# Patient Record
Sex: Female | Born: 1945 | Race: White | Hispanic: No | Marital: Married | Smoking: Never smoker
Health system: Southern US, Community
[De-identification: ages and names within clinical notes are randomized; demographics above are authoritative.]

## PROBLEM LIST (undated history)

## (undated) DIAGNOSIS — L409 Psoriasis, unspecified: Secondary | ICD-10-CM

## (undated) DIAGNOSIS — I10 Essential (primary) hypertension: Secondary | ICD-10-CM

## (undated) DIAGNOSIS — E7800 Pure hypercholesterolemia, unspecified: Secondary | ICD-10-CM

---

## 2000-04-22 ENCOUNTER — Inpatient Hospital Stay (HOSPITAL_COMMUNITY): Admission: AD | Admit: 2000-04-22 | Discharge: 2000-04-27 | Payer: Self-pay | Admitting: Internal Medicine

## 2000-04-23 ENCOUNTER — Encounter: Payer: Self-pay | Admitting: Internal Medicine

## 2001-12-19 ENCOUNTER — Ambulatory Visit (HOSPITAL_COMMUNITY): Admission: RE | Admit: 2001-12-19 | Discharge: 2001-12-19 | Payer: Self-pay | Admitting: Internal Medicine

## 2001-12-19 ENCOUNTER — Encounter: Payer: Self-pay | Admitting: Internal Medicine

## 2010-10-09 ENCOUNTER — Other Ambulatory Visit (HOSPITAL_COMMUNITY): Payer: Self-pay | Admitting: Dermatology

## 2010-10-09 DIAGNOSIS — Z79899 Other long term (current) drug therapy: Secondary | ICD-10-CM

## 2010-10-20 ENCOUNTER — Other Ambulatory Visit: Payer: Self-pay | Admitting: Diagnostic Radiology

## 2010-10-20 ENCOUNTER — Ambulatory Visit (HOSPITAL_COMMUNITY): Payer: Medicare Other

## 2010-10-20 ENCOUNTER — Other Ambulatory Visit: Payer: Self-pay | Admitting: Dermatology

## 2010-10-20 ENCOUNTER — Ambulatory Visit (HOSPITAL_COMMUNITY)
Admission: RE | Admit: 2010-10-20 | Discharge: 2010-10-20 | Disposition: A | Discharge status: Home / Self Care | Payer: Medicare Other | Source: Ambulatory Visit | Attending: Dermatology | Admitting: Dermatology

## 2010-10-20 DIAGNOSIS — Z79899 Other long term (current) drug therapy: Secondary | ICD-10-CM

## 2010-10-20 DIAGNOSIS — Z79631 Long term (current) use of antimetabolite agent: Secondary | ICD-10-CM

## 2010-10-20 DIAGNOSIS — Z5181 Encounter for therapeutic drug level monitoring: Secondary | ICD-10-CM | POA: Insufficient documentation

## 2010-10-20 LAB — APTT: aPTT: 33 seconds (ref 24–37)

## 2010-10-20 LAB — PROTIME-INR
INR: 1.03 (ref 0.00–1.49)
Prothrombin Time: 13.7 seconds (ref 11.6–15.2)

## 2010-10-20 LAB — CBC
HCT: 40.1 % (ref 36.0–46.0)
Hemoglobin: 13 g/dL (ref 12.0–15.0)
MCH: 30.2 pg (ref 26.0–34.0)
MCHC: 32.4 g/dL (ref 30.0–36.0)
MCV: 93 fL (ref 78.0–100.0)
Platelets: 214 10*3/uL (ref 150–400)
RBC: 4.31 MIL/uL (ref 3.87–5.11)
RDW: 14.1 % (ref 11.5–15.5)
WBC: 6.6 10*3/uL (ref 4.0–10.5)

## 2012-10-29 IMAGING — US US BIOPSY
1 series · 13 of 17 positions shown · non-contrast
Comparison: none

Clinical: Long-term methotrexate use.  Request made for random
liver core biopsy.

ULTRASOUND-GUIDED RANDOM CORE LIVER BIOPSY:
An ultrasound guided liver biopsy was thoroughly discussed with the
patient and questions were answered. The benefits, risks,
alternatives, and complications were also discussed. The patient
understands and wishes to proceed with the procedure. A verbal as
well as written consent was obtained.
Ultrasound imaging of the liver was performed and an appropriate
skin entry site was determined. Skin site was marked, prepped and
draped in the usual sterile fashion. 1% Lidocaine was infiltrated
locally. A 17 gauge Trocar needle was advanced under ultrasound
guidance into the liver. Imaging was obtained documenting
appropriate needle position.  Three coaxial 18 gauge core samples
were then obtained and sent to the laboratory for further analysis.
A small amount of Gelfoam was inserted under ultrasound guidance to
assist with hemostasis.  Post procedure scans demonstrate no
evidence of bleeding or hematoma.  The patient tolerated the
procedure well with no immediate complication.
Cardiac and respiratory monitoring was provided by the radiology
RN.  Moderate sedation in the form of 75 mcg fentanyl and 4 mg of
versed were administered throughout the procedure for a total of 20
minutes.

[Series 1: us biopsy · 0.26mm/px · 13 of 17 slices shown]
[im 1/17]
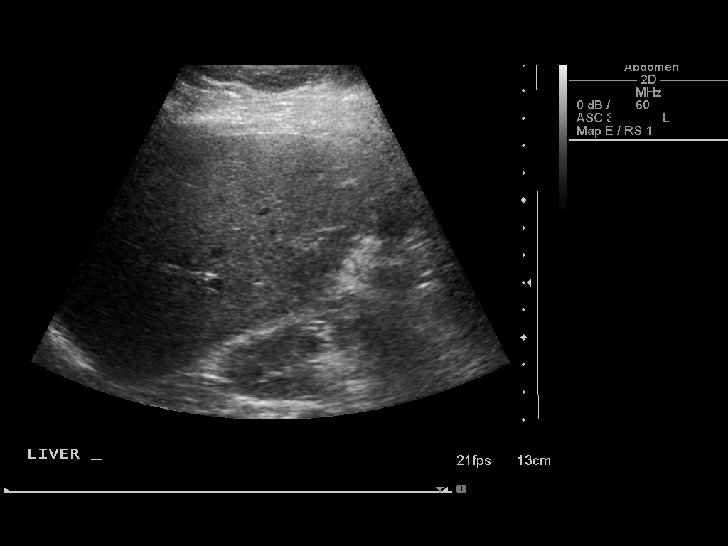
[im 2/17]
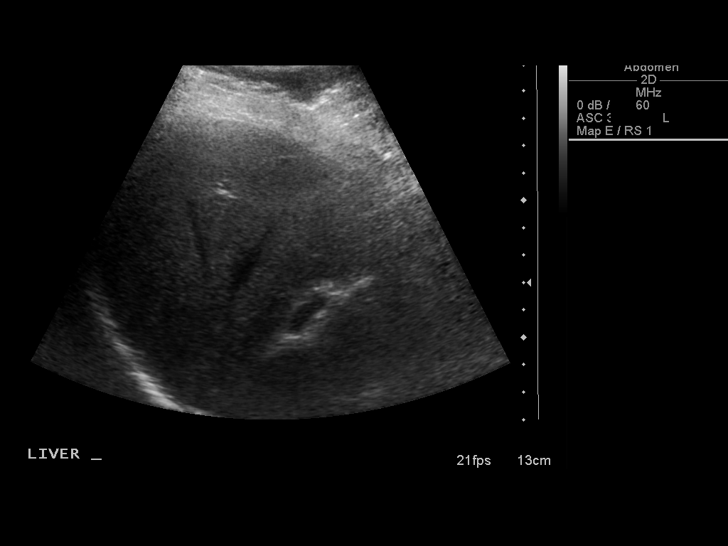
[im 4/17]
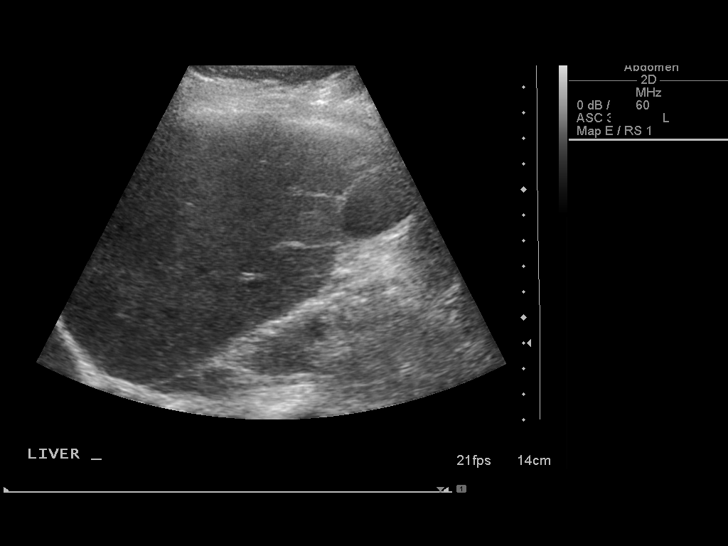
[im 5/17]
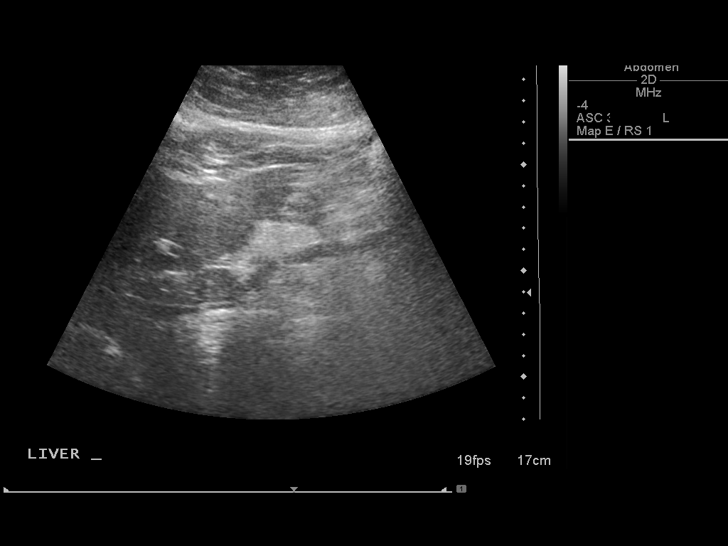
[im 6/17]
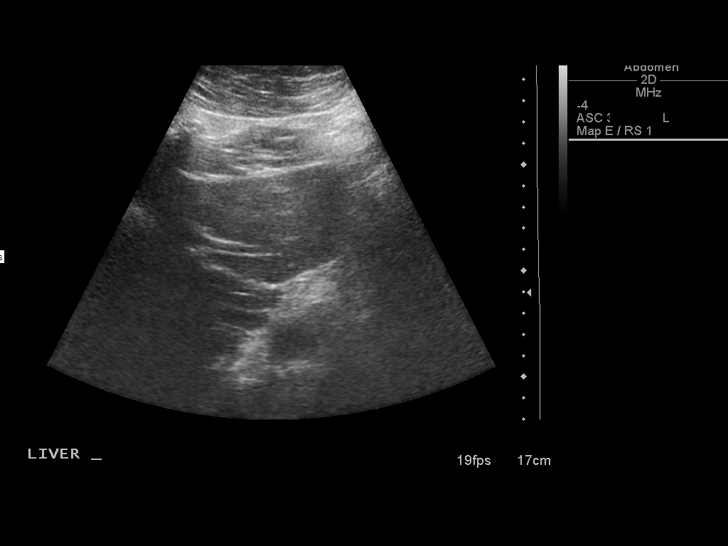
[im 8/17]
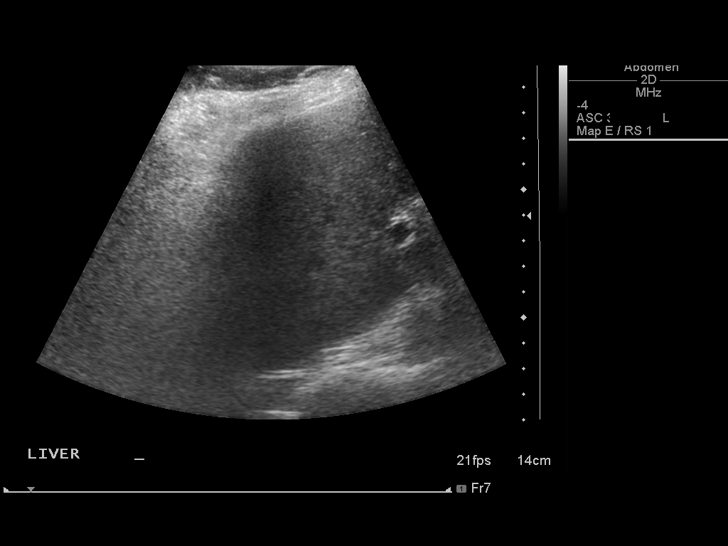
[im 9/17]
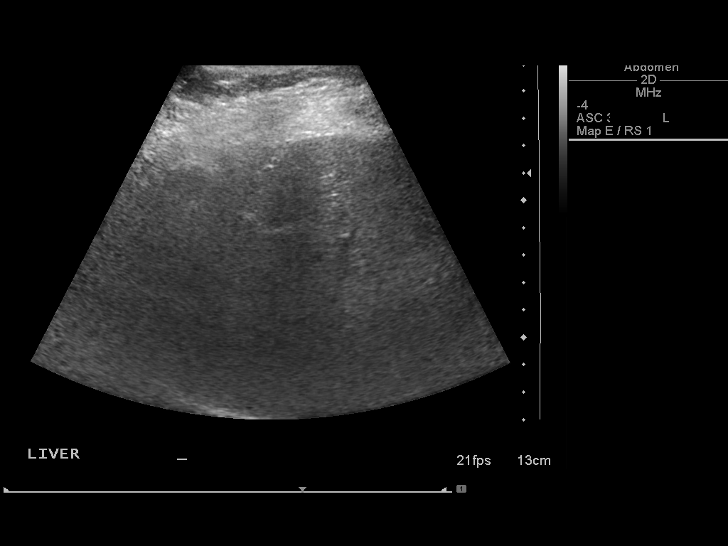
[im 10/17]
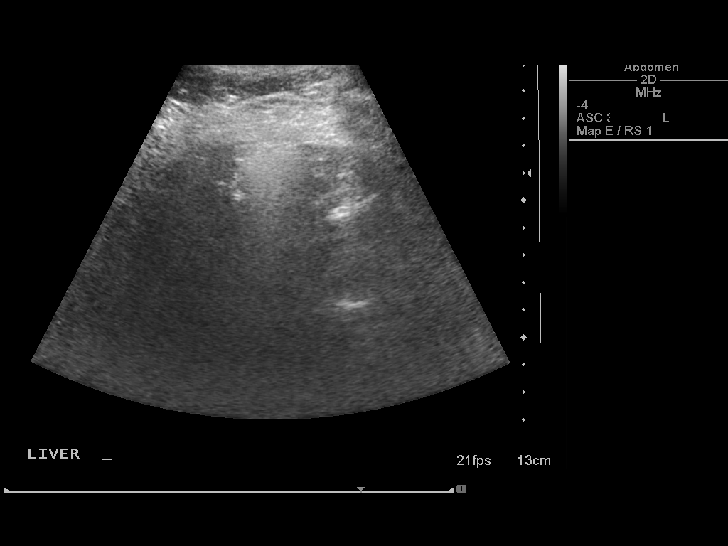
[im 12/17]
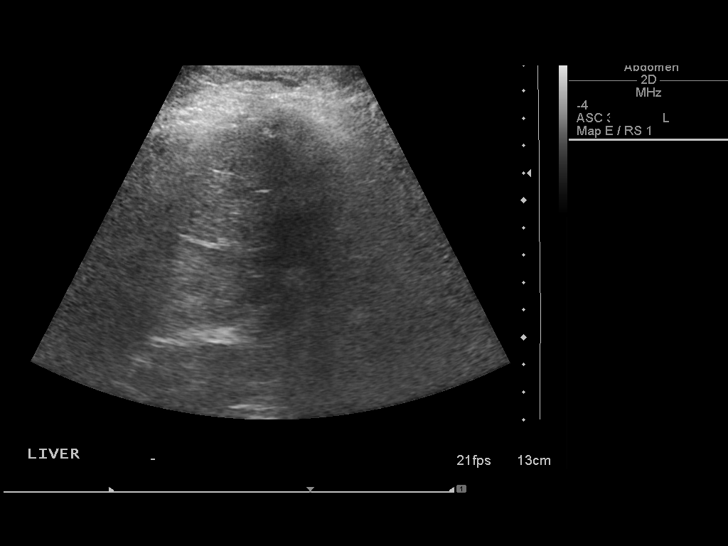
[im 13/17]
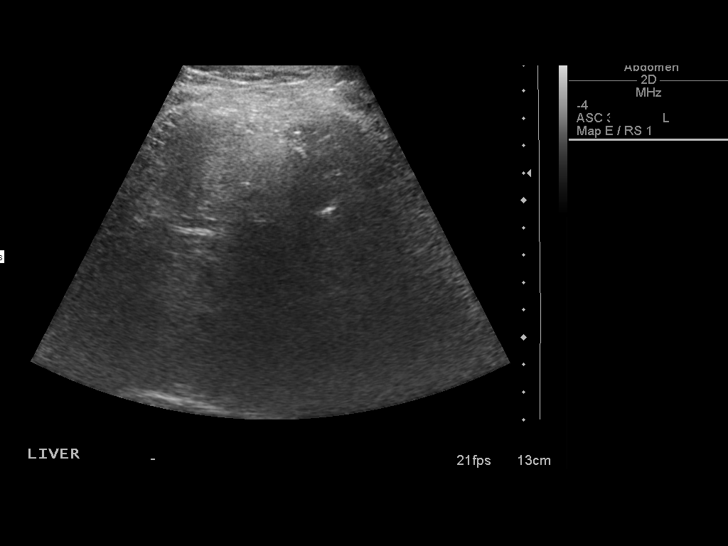
[im 14/17]
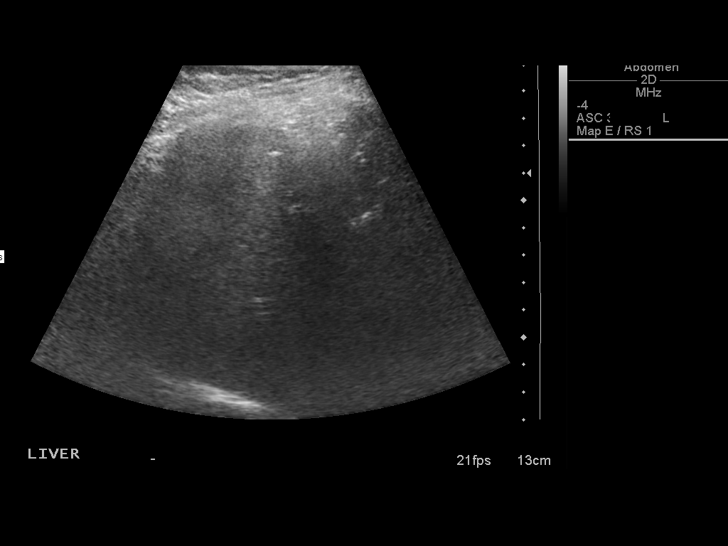
[im 16/17]
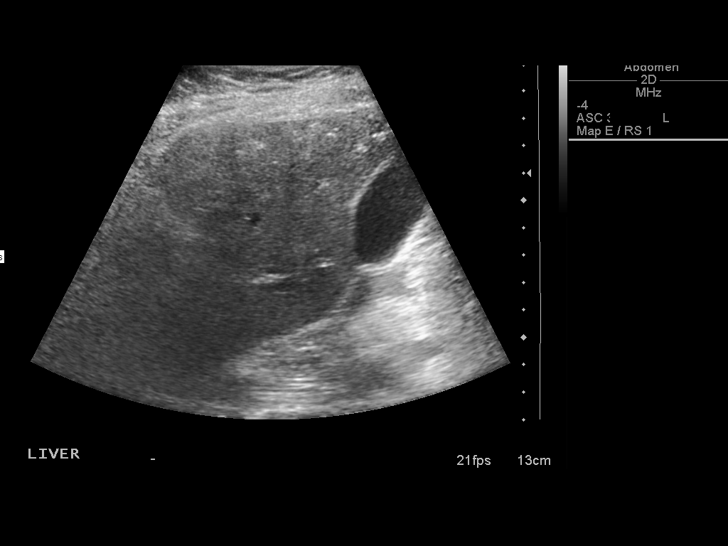
[im 17/17]
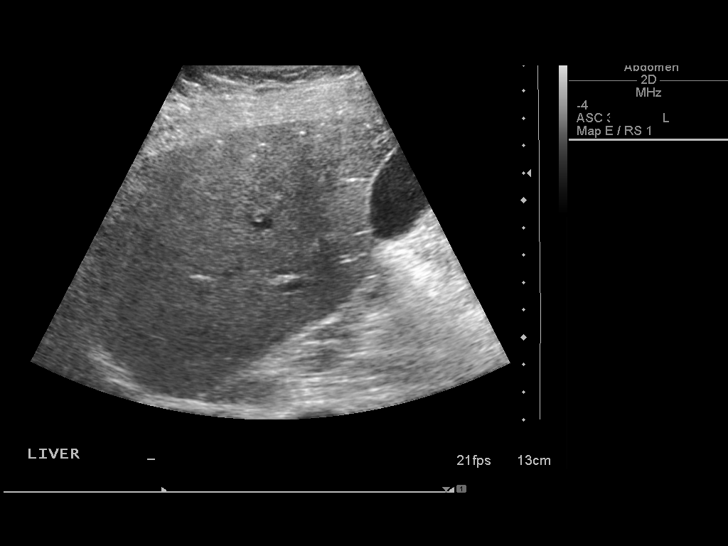

[13 of 17 positions shown; findings below may reference images not displayed]

IMPRESSION: 1. Technically successful ultrasound guided random liver core
biopsy with moderate sedation as described above.

Read by: Palaio, Shyamah.-MDZ

## 2015-01-17 ENCOUNTER — Other Ambulatory Visit (HOSPITAL_COMMUNITY): Payer: Self-pay | Admitting: Dermatology

## 2015-01-17 DIAGNOSIS — Z79899 Other long term (current) drug therapy: Secondary | ICD-10-CM

## 2015-01-17 DIAGNOSIS — Z79631 Long term (current) use of antimetabolite agent: Secondary | ICD-10-CM

## 2015-01-23 ENCOUNTER — Other Ambulatory Visit: Payer: Self-pay | Admitting: Radiology

## 2015-01-24 ENCOUNTER — Ambulatory Visit (HOSPITAL_COMMUNITY)
Admission: RE | Admit: 2015-01-24 | Discharge: 2015-01-24 | Disposition: A | Discharge status: Home / Self Care | Payer: Medicare Other | Source: Ambulatory Visit | Attending: Dermatology | Admitting: Dermatology

## 2015-01-24 ENCOUNTER — Encounter (HOSPITAL_COMMUNITY): Payer: Self-pay

## 2015-01-24 DIAGNOSIS — Z880 Allergy status to penicillin: Secondary | ICD-10-CM | POA: Insufficient documentation

## 2015-01-24 DIAGNOSIS — Z79899 Other long term (current) drug therapy: Secondary | ICD-10-CM | POA: Insufficient documentation

## 2015-01-24 DIAGNOSIS — L409 Psoriasis, unspecified: Secondary | ICD-10-CM | POA: Diagnosis not present

## 2015-01-24 DIAGNOSIS — E7800 Pure hypercholesterolemia, unspecified: Secondary | ICD-10-CM | POA: Diagnosis not present

## 2015-01-24 DIAGNOSIS — Z5181 Encounter for therapeutic drug level monitoring: Secondary | ICD-10-CM | POA: Insufficient documentation

## 2015-01-24 DIAGNOSIS — I10 Essential (primary) hypertension: Secondary | ICD-10-CM | POA: Diagnosis not present

## 2015-01-24 DIAGNOSIS — K760 Fatty (change of) liver, not elsewhere classified: Secondary | ICD-10-CM | POA: Insufficient documentation

## 2015-01-24 DIAGNOSIS — Z79631 Long term (current) use of antimetabolite agent: Secondary | ICD-10-CM | POA: Insufficient documentation

## 2015-01-24 HISTORY — DX: Pure hypercholesterolemia, unspecified: E78.00

## 2015-01-24 HISTORY — DX: Essential (primary) hypertension: I10

## 2015-01-24 HISTORY — DX: Psoriasis, unspecified: L40.9

## 2015-01-24 LAB — CBC
HCT: 43.1 % (ref 36.0–46.0)
Hemoglobin: 14.2 g/dL (ref 12.0–15.0)
MCH: 30.6 pg (ref 26.0–34.0)
MCHC: 32.9 g/dL (ref 30.0–36.0)
MCV: 92.9 fL (ref 78.0–100.0)
PLATELETS: 191 10*3/uL (ref 150–400)
RBC: 4.64 MIL/uL (ref 3.87–5.11)
RDW: 14.7 % (ref 11.5–15.5)
WBC: 5 10*3/uL (ref 4.0–10.5)

## 2015-01-24 LAB — PROTIME-INR
INR: 1.07 (ref 0.00–1.49)
Prothrombin Time: 14.1 seconds (ref 11.6–15.2)

## 2015-01-24 LAB — APTT: aPTT: 28 seconds (ref 24–37)

## 2015-01-24 MED ORDER — MIDAZOLAM HCL 2 MG/2ML IJ SOLN
INTRAMUSCULAR | Status: AC
  Filled 2015-01-24: qty 2

## 2015-01-24 MED ORDER — FENTANYL CITRATE (PF) 100 MCG/2ML IJ SOLN
INTRAMUSCULAR | Status: AC
  Filled 2015-01-24: qty 2

## 2015-01-24 MED ORDER — SODIUM CHLORIDE 0.9 % IV SOLN
INTRAVENOUS | Status: DC
  Administered 2015-01-24: 15:00:00 via INTRAVENOUS

## 2015-01-24 MED ORDER — LIDOCAINE HCL (PF) 1 % IJ SOLN
INTRAMUSCULAR | Status: AC
  Filled 2015-01-24: qty 10

## 2015-01-24 MED ORDER — FENTANYL CITRATE (PF) 100 MCG/2ML IJ SOLN
INTRAMUSCULAR | Status: AC | PRN
  Administered 2015-01-24: 50 ug via INTRAVENOUS

## 2015-01-24 MED ORDER — HYDROCODONE-ACETAMINOPHEN 5-325 MG PO TABS: 1.0000 | ORAL_TABLET | ORAL | Status: DC | PRN

## 2015-01-24 MED ORDER — MIDAZOLAM HCL 2 MG/2ML IJ SOLN
INTRAMUSCULAR | Status: AC | PRN
Start: 2015-01-24 — End: 2015-01-24
  Administered 2015-01-24: 1 mg via INTRAVENOUS

## 2015-01-24 MED ORDER — GELATIN ABSORBABLE 12-7 MM EX MISC
CUTANEOUS | Status: AC
  Filled 2015-01-24: qty 1

## 2015-01-24 NOTE — Procedures (Signed)
Interventional Radiology Procedure Note  Procedure:  Ultrasound guided liver biopsy  Complications:  None   Estimated Blood Loss: < 25 mL  18 G core biopsy x 2 via 17 G needle.  No complications.   Jodi MarbleGlenn T. Fredia SorrowYamagata, M.D Pager:  [phone]

## 2015-01-24 NOTE — Sedation Documentation (Signed)
Patient is resting comfortably. 

## 2015-01-24 NOTE — H&P (Signed)
Chief Complaint: Patient was seen in consultation today for methotrexate monitoring at the request of Erika Cox  Referring Physician(s): Erika Cox  History of Present Illness: Erika Cox is a 69 y.o. female   Taking methotrexate medication for psoriasis off and on over 10 yrs Has had 2 other monitoring liver biopsies Now scheduled for liver core biopsy today  Past Medical History  Diagnosis Date  . Psoriasis   . High cholesterol   . Hypertension     History reviewed. No pertinent past surgical history.  Allergies: Cephalosporins; Penicillins; and Feldene  Medications: Prior to Admission medications   Medication Sig Start Date End Date Taking? Authorizing Provider  Calcium Citrate-Vitamin D (CALCIUM CITRATE + D3) 200-250 MG-UNIT TABS Take 1 tablet by mouth daily.   Yes Historical Provider, MD  dorzolamide (TRUSOPT) 2 % ophthalmic solution Place 1 drop into the right eye 2 (two) times daily.   Yes Historical Provider, MD  ergocalciferol (VITAMIN D2) 50000 UNITS capsule Take 50,000 Units by mouth once a week. Tuesday   Yes Historical Provider, MD  folic acid (FOLVITE) 1 MG tablet Take 1 mg by mouth daily.   Yes Historical Provider, MD  irbesartan (AVAPRO) 150 MG tablet Take 150 mg by mouth daily.   Yes Historical Provider, MD  latanoprost (XALATAN) 0.005 % ophthalmic solution Place 1 drop into both eyes at bedtime.   Yes Historical Provider, MD  methotrexate (RHEUMATREX) 2.5 MG tablet Take 17.5 mg by mouth once a week. Friday Caution:Chemotherapy. Protect from light.   Yes Historical Provider, MD  Multiple Vitamin (MULTIVITAMIN WITH MINERALS) TABS tablet Take 1 tablet by mouth daily.   Yes Historical Provider, MD  Omega-3 Fatty Acids (FISH OIL) 1000 MG CAPS Take 1,000 mg by mouth 2 (two) times daily.   Yes Historical Provider, MD  pravastatin (PRAVACHOL) 10 MG tablet Take 10 mg by mouth at bedtime.   Yes Historical Provider, MD  vitamin E 400 UNIT capsule Take 400 Units by  mouth 2 (two) times daily.   Yes Historical Provider, MD     History reviewed. No pertinent family history.  Social History   Social History  . Marital Status: Married    Spouse Name: N/A  . Number of Children: N/A  . Years of Education: N/A   Social History Main Topics  . Smoking status: Never Smoker   . Smokeless tobacco: None  . Alcohol Use: None  . Drug Use: None  . Sexual Activity: Not Asked   Other Topics Concern  . None   Social History Narrative  . None    Review of Systems: A 12 point ROS discussed and pertinent positives are indicated in the HPI above.  All other systems are negative.  Review of Systems  Constitutional: Negative for activity change.  Respiratory: Negative for cough and shortness of breath.   Gastrointestinal: Negative for nausea and diarrhea.  Neurological: Negative for weakness.  Psychiatric/Behavioral: Negative for behavioral problems and confusion.    Vital Signs: BP 158/76 mmHg  Pulse 77  Temp(Src) 98 F (36.7 C) (Oral)  Resp 18  Ht 5\' 2"  (1.575 m)  Wt 160 lb (72.576 kg)  BMI 29.26 kg/m2  SpO2 100%  Physical Exam  Constitutional: She is oriented to person, place, and time. She appears well-nourished.  Cardiovascular: Normal rate, regular rhythm and normal heart sounds.   Pulmonary/Chest: Effort normal and breath sounds normal.  Abdominal: Soft. Bowel sounds are normal. There is no tenderness.  Musculoskeletal: Normal range of motion.  Neurological: She is alert and oriented to person, place, and time.  Skin: Skin is warm and dry.  Psychiatric: She has a normal mood and affect. Her behavior is normal. Judgment and thought content normal.  Nursing note and vitals reviewed.   Mallampati Score:  MD Evaluation Airway: WNL Heart: WNL Abdomen: WNL Chest/ Lungs: WNL ASA  Classification: 2 Mallampati/Airway Score: One  Imaging: No results found.  Labs:  CBC: No results for input(s): WBC, HGB, HCT, PLT in the last 8760  hours.  COAGS: No results for input(s): INR, APTT in the last 8760 hours.  BMP: No results for input(s): NA, K, CL, CO2, GLUCOSE, BUN, CALCIUM, CREATININE, GFRNONAA, GFRAA in the last 8760 hours.  Invalid input(s): CMP  LIVER FUNCTION TESTS: No results for input(s): BILITOT, AST, ALT, ALKPHOS, PROT, ALBUMIN in the last 8760 hours.  TUMOR MARKERS: No results for input(s): AFPTM, CEA, CA199, CHROMGRNA in the last 8760 hours.  Assessment and Plan:  Psoriasis Chronic methotrexate  Scheduled for liver core biopsy Risks and Benefits discussed with the patient including, but not limited to bleeding, infection, damage to adjacent structures or low yield requiring additional tests. All of the patient's questions were answered, patient is agreeable to proceed. Consent signed and in chart.   Thank you for this interesting consult.  I greatly enjoyed meeting Erika Cox and look forward to participating in their care.  A copy of this report was sent to the requesting provider on this date.  Signed: Dhiya Smits A 01/24/2015, 1:34 PM   I spent a total of  30 Minutes   in face to face in clinical consultation, greater than 50% of which was counseling/coordinating care for liver core bx

## 2015-01-24 NOTE — Discharge Instructions (Signed)

## 2017-01-06 IMAGING — US US BIOPSY
1 series · 7 of 7 positions shown · non-contrast
Comparison: none

CLINICAL DATA: Long-term methotrexate use for psoriasis and need
for liver biopsy to assess for liver toxicity.

[Series 1: us biopsy · 0.17mm/px · 7 of 7 slices shown]
[im 1/7]
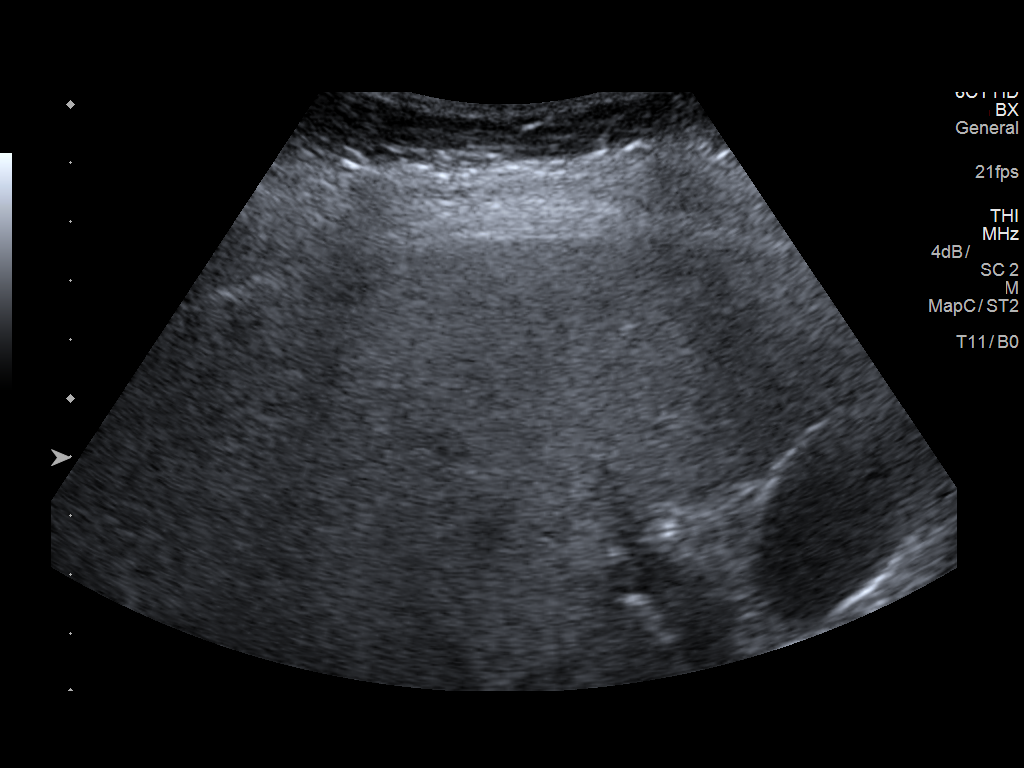
[im 2/7]
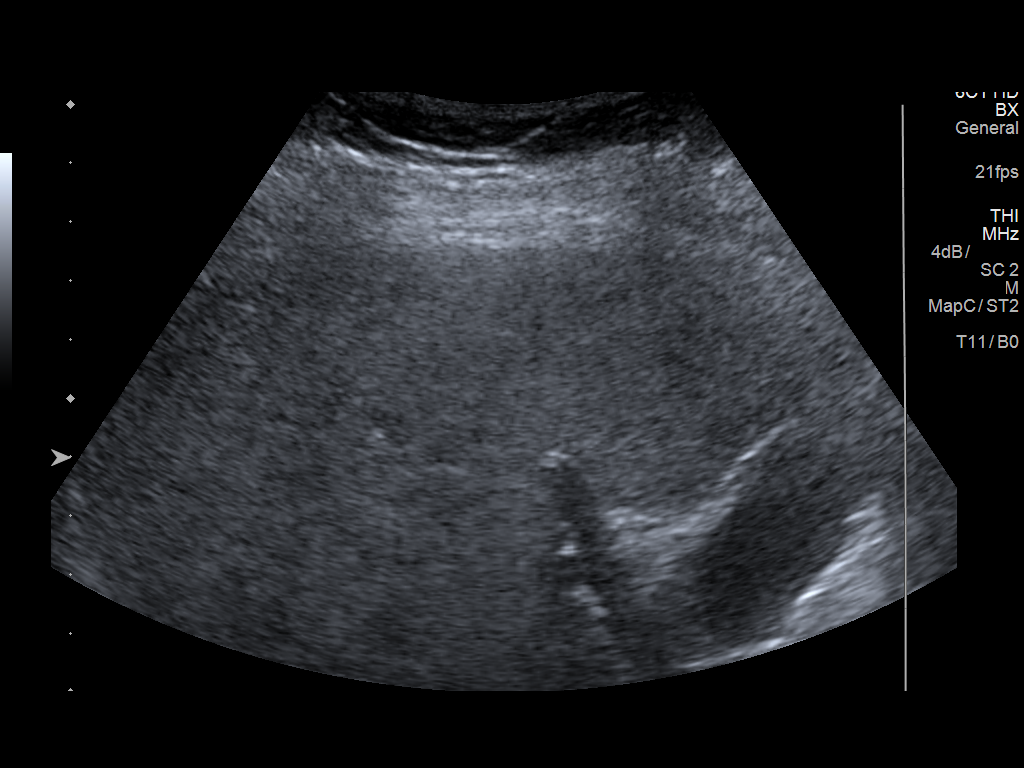
[im 3/7]
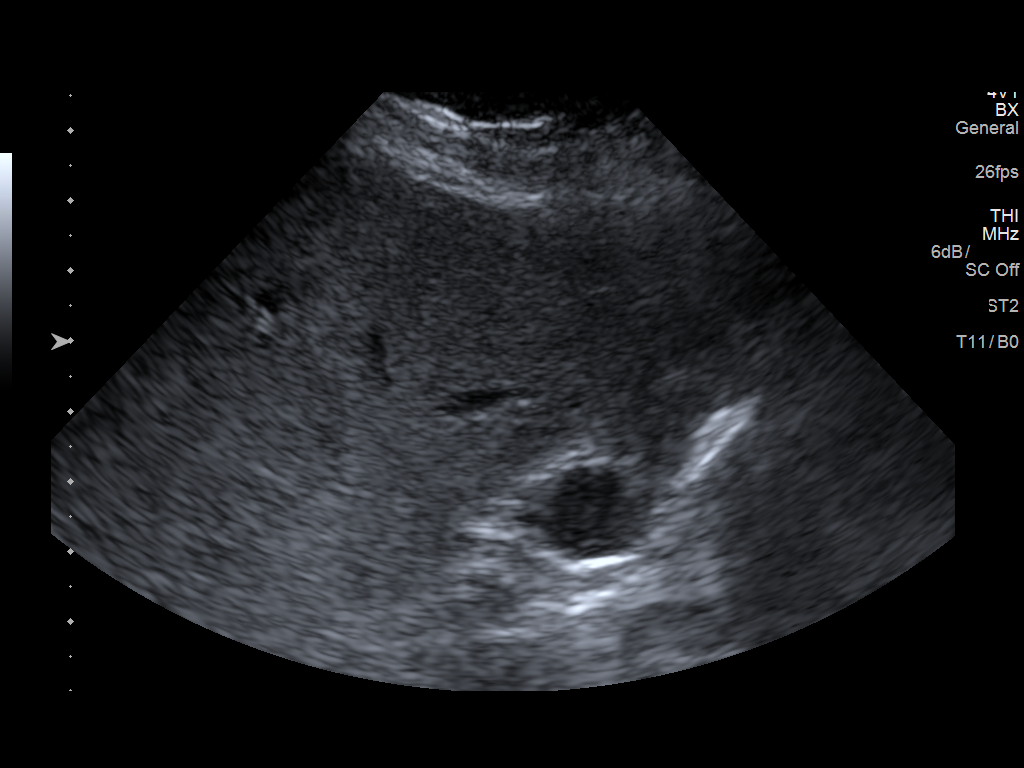
[im 4/7]
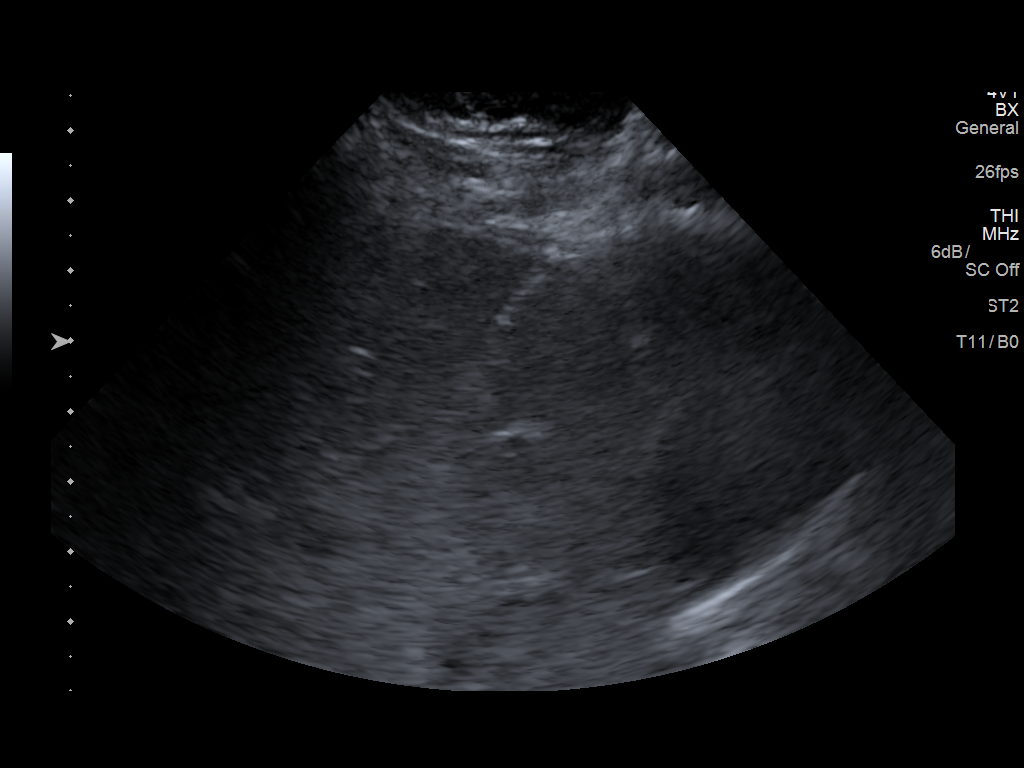
[im 5/7]
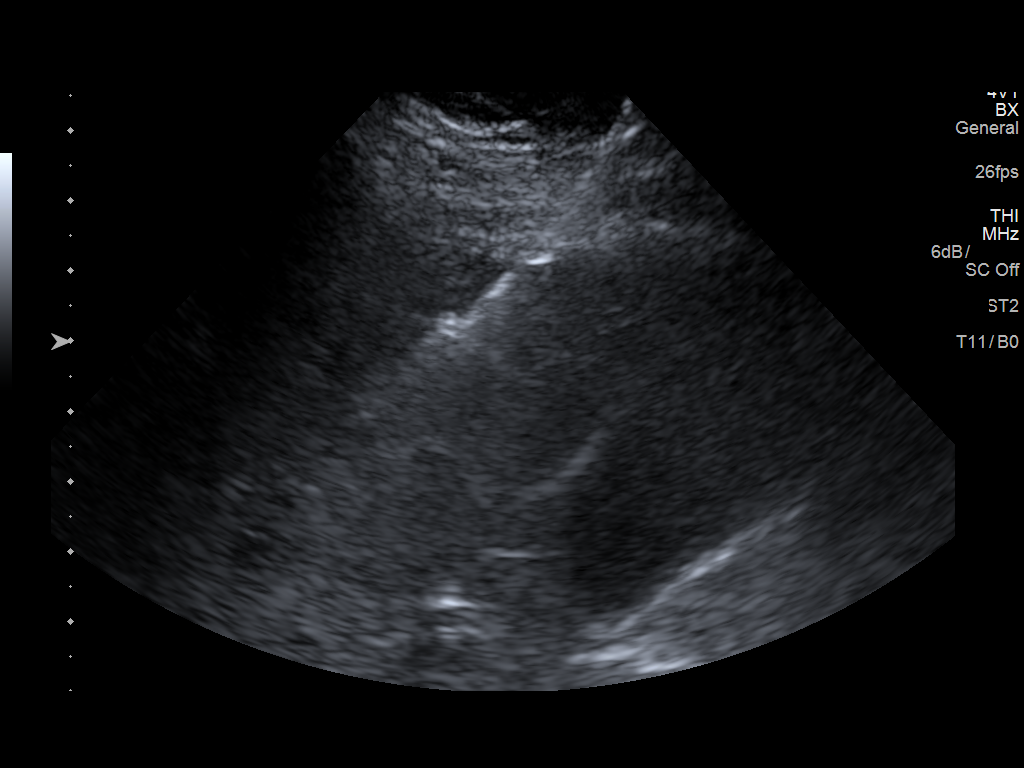
[im 6/7]
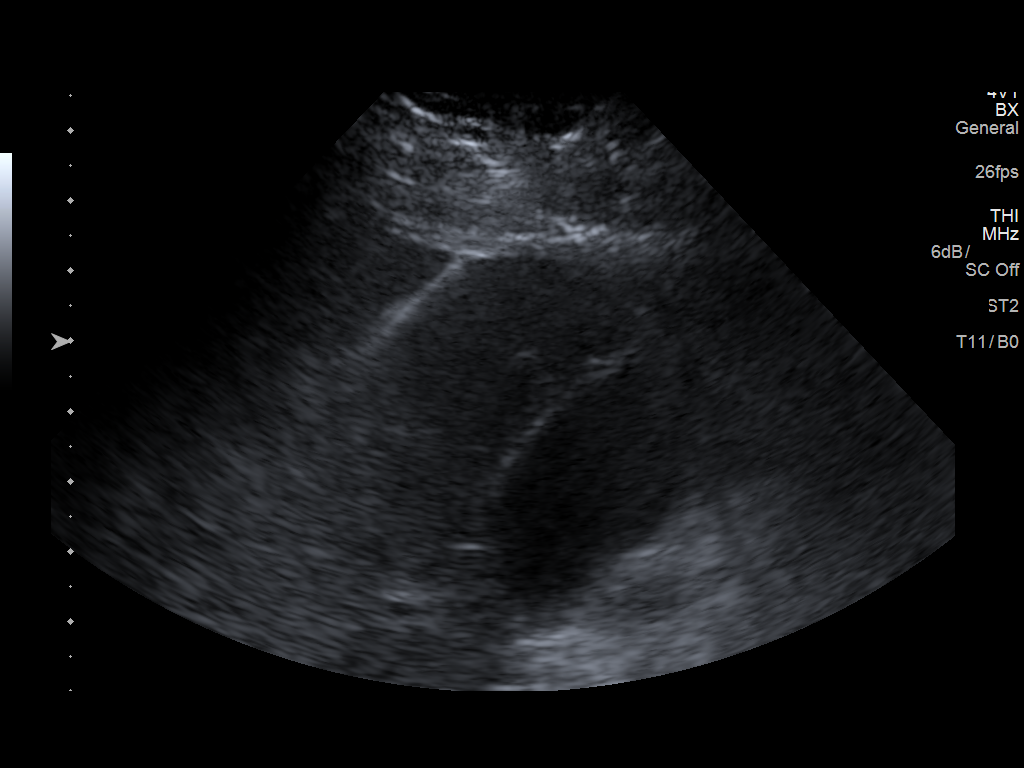
[im 7/7]
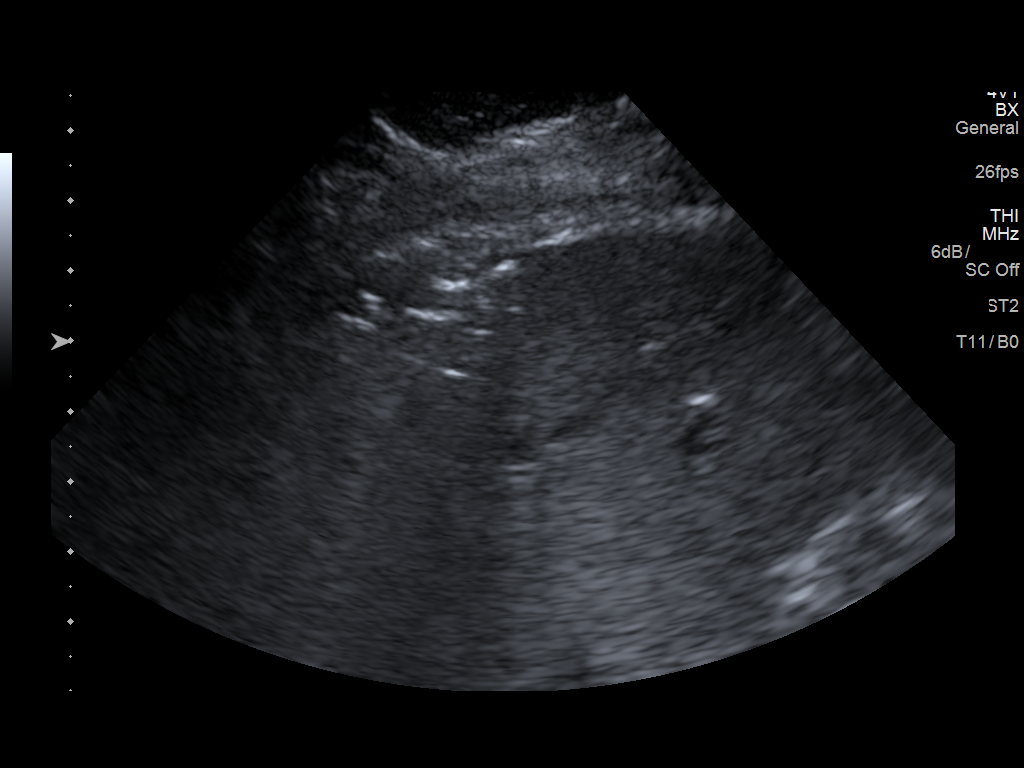

[7 of 7 positions shown; findings below may reference images not displayed]

EXAM:
ULTRASOUND GUIDED CORE BIOPSY OF LIVER

MEDICATIONS:
1.0 mg IV Versed; 50 mcg IV Fentanyl

Total Moderate Sedation Time: 9 minutes.

PROCEDURE:
The procedure, risks, benefits, and alternatives were explained to
the patient. Questions regarding the procedure were encouraged and
answered. The patient understands and consents to the procedure. A
time-out was performed prior to the procedure.

The right abdominal wall was prepped with Betadine in a sterile
fashion, and a sterile drape was applied covering the operative
field. A sterile gown and sterile gloves were used for the
procedure. Local anesthesia was provided with 1% Lidocaine.

Under ultrasound guidance, a 17 gauge needle was advanced into the
right lobe of the liver. A total of 2 separate coaxial 18 gauge core
biopsy samples were obtained and submitted in formalin. As the outer
needle was retracted, Gel-Foam pledgets were advanced into the
parenchyma.

COMPLICATIONS:
None.
FINDINGS: Solid tissue was obtained.  There were no immediate complications.
IMPRESSION: Ultrasound-guided core biopsy performed of the right lobe of the
liver.
